# Patient Record
Sex: Female | Born: 2005 | Race: White | Hispanic: No | Marital: Single | Smoking: Never smoker
Health system: Southern US, Community
[De-identification: ages and names within clinical notes are randomized; demographics above are authoritative.]

## PROBLEM LIST (undated history)

## (undated) DIAGNOSIS — K5900 Constipation, unspecified: Secondary | ICD-10-CM

## (undated) DIAGNOSIS — F419 Anxiety disorder, unspecified: Secondary | ICD-10-CM

## (undated) DIAGNOSIS — F32A Depression, unspecified: Secondary | ICD-10-CM

## (undated) DIAGNOSIS — N390 Urinary tract infection, site not specified: Secondary | ICD-10-CM

## (undated) DIAGNOSIS — E669 Obesity, unspecified: Secondary | ICD-10-CM

## (undated) HISTORY — DX: Constipation, unspecified: K59.00

## (undated) HISTORY — DX: Obesity, unspecified: E66.9

## (undated) HISTORY — PX: TYMPANOSTOMY TUBE PLACEMENT: SHX32

## (undated) HISTORY — DX: Depression, unspecified: F32.A

---
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
## 2024-01-06 ENCOUNTER — Encounter (INDEPENDENT_AMBULATORY_CARE_PROVIDER_SITE_OTHER): Payer: Self-pay | Admitting: Nurse Practitioner

## 2024-01-06 ENCOUNTER — Ambulatory Visit (INDEPENDENT_AMBULATORY_CARE_PROVIDER_SITE_OTHER): Payer: Self-pay | Admitting: Nurse Practitioner

## 2024-01-06 ENCOUNTER — Ambulatory Visit: Admitting: Gastroenterology

## 2024-01-06 VITALS — BP 108/75 | HR 82 | Temp 97.8°F | Ht 65.0 in | Wt 259.0 lb

## 2024-01-06 DIAGNOSIS — F322 Major depressive disorder, single episode, severe without psychotic features: Secondary | ICD-10-CM

## 2024-01-06 DIAGNOSIS — E559 Vitamin D deficiency, unspecified: Secondary | ICD-10-CM | POA: Diagnosis not present

## 2024-01-06 DIAGNOSIS — E66813 Obesity, class 3: Secondary | ICD-10-CM

## 2024-01-06 DIAGNOSIS — E88819 Insulin resistance, unspecified: Secondary | ICD-10-CM | POA: Diagnosis not present

## 2024-01-06 DIAGNOSIS — Z6841 Body Mass Index (BMI) 40.0 and over, adult: Secondary | ICD-10-CM

## 2024-01-06 MED ORDER — METFORMIN HCL ER 500 MG PO TB24: 500.0000 mg | ORAL_TABLET | Freq: Every day | ORAL | 0 refills | Status: DC

## 2024-01-06 MED ORDER — VITAMIN D (ERGOCALCIFEROL) 1.25 MG (50000 UNIT) PO CAPS: 50000.0000 [IU] | ORAL_CAPSULE | ORAL | 1 refills | Status: AC

## 2024-01-06 NOTE — Progress Notes (Signed)
 Office: [phone]  /  Fax: [phone]  WEIGHT SUMMARY AND BIOMETRICS  Weight Lost Since Last Visit: 0  Weight Gained Since Last Visit: 1 lb   Vitals Temp: 97.8 F (36.6 C) BP: 108/75 Pulse Rate: 82 SpO2: 98 %   Anthropometric Measurements Height: 5' 5 (1.651 m) Weight: 259 lb (117.5 kg) BMI (Calculated): 43.1 Weight at Last Visit: 258 lb Weight Lost Since Last Visit: 0 Weight Gained Since Last Visit: 1 lb Starting Weight: 258 lb Total Weight Loss (lbs): 0 lb (0 kg) Peak Weight: 296 lb   Body Composition  Body Fat %: 50.2 % Fat Mass (lbs): 130.2 lbs Muscle Mass (lbs): 1226 lbs Total Body Water (lbs): 93.8 lbs Visceral Fat Rating : 12   Other Clinical Data Fasting: No Labs: No Today's Visit #: 2 Starting Date: 12/23/23    Total Weight Loss: 0 Percent of body weight lost: 0  Bio Impedence: Down 0.2 pound of muscle and up 0.8 pound of adipose  HPI  Chief Complaint: OBESITY  Arloa is here to discuss her progress with her obesity treatment plan. She is on the the Category 2 Plan and states she is following her eating plan approximately 50 % of the time. She states she is not currently exercising   Interval History:  Since last office visit she has been doing less sugar coffee drinks. She is drinking lots of water. She has good control during the day but after dinner around 6-7 she is getting cravings for sweets and salty/crunchy. 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 She has a history of depression but mood is currently well controlled without medication.   24 hour recall- looked up at the nutrition information with patient and totaled 1500 calories and this was a day she did not snack Breakfast: grilled chicken McMuffin biscuitville Lunch: Salad and spaghetti(olive Garden), pumpkin spice latte with skim milk Dinner: Steak Chipotle bowl , sweet tea Snacks : none yesterday   PHYSICAL EXAM:  Blood pressure  108/75, pulse 82, temperature 97.8 F (36.6 C), height 5' 5 (1.651 m), weight 259 lb (117.5 kg), SpO2 98%. Body mass index is 43.1 kg/m.  General: She is overweight, cooperative, alert, well developed, and in no acute distress. PSYCH: Has normal mood, affect and thought process.   Extremities: No edema.  Neurologic: No gross sensory or motor deficits. No tremors or fasciculations noted.    DIAGNOSTIC DATA REVIEWED: ALL LAB ARE REVIEWED IN DETAIL WITH PATIENT  Last metabolic panel Lab Results  Component Value Date   GLUCOSE 79 12/23/2023   NA 139 12/23/2023   K 4.6 12/23/2023   CL 102 12/23/2023   CO2 24 12/23/2023   BUN 10 12/23/2023   CREATININE 0.60 12/23/2023   EGFR 133 12/23/2023   CALCIUM 9.2 12/23/2023   PROT 7.4 12/14/2023   ALBUMIN 3.8 12/14/2023   BILITOT 0.8 12/14/2023   ALKPHOS 40 12/14/2023   AST 17 12/14/2023   ALT 17 12/14/2023   ANIONGAP 9 12/14/2023   Normal electrolytes, kidney and liver functions  MOST RECENT A1C is 4.9 07/10/23 A1c is normal   TSH 07/10/23 was 1.695 Free T4 0.9  Both are normal  Lab Results  Component Value Date   INSULIN  13.0 12/23/2023  HOMA-IR score 2.53- insulin  resistance    CBC    Component Value Date/Time   WBC 10.8 (H) 12/14/2023 2058   RBC 4.74 12/14/2023 2058   HGB 13.8 12/14/2023 2058  HCT 40.8 12/14/2023 2058   PLT 270 12/14/2023 2058   MCV 86.1 12/14/2023 2058   MCH 29.1 12/14/2023 2058   MCHC 33.8 12/14/2023 2058   RDW 12.7 12/14/2023 2058  CBC is normal  Lipid Panel  Most recent lipid panel 07/10/23 Total Cholesterol 151 Triglycerides- 72 HDL 51 LDL 84 Non-HDL 100 Lipid panel looks good    Nutritional Lab Results  Component Value Date   VD25OH 25.7 (L) 12/23/2023  Vit D is low, Vit D supplementation is required   ASSESSMENT AND PLAN  TREATMENT PLAN FOR OBESITY:  Recommended Dietary Goals  Gigi is currently in the action stage of change. As such, her goal is to continue weight  management plan. She has agreed to the Category 2 Plan.  Behavioral Intervention  We discussed the following Behavioral Modification Strategies today: better snacking choices, continue to work on maintaining a reduced calorie state, getting the recommended amount of protein, incorporating whole foods, making healthy choices, staying well hydrated and practicing mindfulness when eating., and increase protein intake, fibrous foods (25 grams per day for women, 30 grams for men) and water to improve satiety and decrease hunger signals. .  Additional resources provided today: NA  Recommended Physical Activity Goals  Sissi has been advised to work up to 150 minutes of moderate intensity aerobic activity a week and strengthening exercises 2-3 times per week for cardiovascular health, weight loss maintenance and preservation of muscle mass.   She has agreed to Think about enjoyable ways to increase daily physical activity and overcoming barriers to exercise, Increase physical activity in their day and reduce sedentary time (increase NEAT)., and Start aerobic activity with a goal of 150 minutes a week at moderate intensity.    Pharmacotherapy We discussed various medication options to help Delissa with her weight loss efforts and we both agreed to start Metfromin XR 500 mg 1 tab PO every day with largest meal for insulin  resistance and off label for weight loss. Will also start ergocalciferol  50000 units once a week. .  ASSOCIATED CONDITIONS ADDRESSED TODAY  Action/Plan  MDD (major depressive disorder), single episode, severe , no psychosis (HCC) Currently well controlled without medication  Insulin  resistance Continue category 2 meal plan and focus on limiting simple carbohydrate, decrease sweet coffee drinks with ultimate goal of stopping altogether Start Metformin  ER 500 mg 1 tab PO every day with largest meal -     metFORMIN  HCl ER; Take 1 tablet (500 mg total) by mouth daily with breakfast.   Dispense: 30 tablet; Refill: 0  Vitamin D  deficiency Initiate ergocalciferol  50000 units once a week.  Will plan for 12 weeks and then recheck value -     Vitamin D  (Ergocalciferol ); Take 1 capsule (50,000 Units total) by mouth every 7 (seven) days.  Dispense: 5 capsule; Refill: 1  Class 3 severe obesity with body mass index (BMI) of 40.0 to 44.9 in adult  Counseled on adherence to category 2 meal plan  Reviewed Fast food healthier options and gave patient handout  Continue to decrease sweet coffee drinks, work on healthier snack options  Start Metformin  500 mg every day off label for weight loss  Encouraged to start cardio with an initial goal of 150 minutes/week        Return in about 2 weeks (around 01/20/2024).SABRA She was informed of the importance of frequent follow up visits to maximize her success with intensive lifestyle modifications for her multiple health conditions.   ATTESTASTION STATEMENTS:  Reviewed by clinician  on day of visit: allergies, medications, problem list, medical history, surgical history, family history, social history, and previous encounter notes.   I personally spent a total of 41 minutes in the care of the patient today including preparing to see the patient, getting/reviewing separately obtained history, performing a medically appropriate exam/evaluation, counseling and educating, placing orders, documenting clinical information in the EHR, independently interpreting results, communicating results, and coordinating care.   Jaynell Castagnola ANP-C

## 2024-01-06 NOTE — Progress Notes (Deleted)
 Paula Allen [id number] October 21, 2005   Chief Complaint: Abdominal pain  Referring Provider: Steva Meeter [facility]* Primary GI MD: Sampson  HPI: Paula Allen is a 18 y.o. female with past medical history of anxiety/depression, constipation, obesity who presents today for a complaint of *** .    Patient seen in the ED 12/14/2023 for complaint of abdominal pain described as a dull ache in her upper abdomen.  Initially felt constipated and was passing only small amount of thin stool, but then again of diarrhea later in the week.  Had some mild epigastric tenderness on exam.  Labs without significant anemia, leukocytosis, electrolyte abnormality, or AKI.  LFTs and lipase unremarkable, pregnancy test negative, no sign of infection on urinalysis.  Gastritis suspected and patient started on PPI.  Ultrasound was ordered by patient's pediatrician but has not been completed.  Follows with healthy weight and wellness.  Recently found to have low vitamin D  25.7    Previous GI Procedures/Imaging      Past Medical History:  Diagnosis Date   Anxiety    Constipation    Depression    Obesity    Urinary tract infection     Past Surgical History:  Procedure Laterality Date   TYMPANOSTOMY TUBE PLACEMENT      Current Outpatient Medications  Medication Sig Dispense Refill   busPIRone  (BUSPAR ) 10 MG tablet Take 1 tablet (10 mg total) by mouth 2 (two) times daily. (Patient not taking: Reported on 01/06/2024) 60 tablet 0   hydrOXYzine  (ATARAX /VISTARIL ) 25 MG tablet Take 1 tablet (25 mg total) by mouth 3 (three) times daily as needed for anxiety. 30 tablet 0   ibuprofen  (ADVIL ) 400 MG tablet Take 400 mg by mouth every 6 (six) hours as needed for headache or mild pain.     metFORMIN  (GLUCOPHAGE -XR) 500 MG 24 hr tablet Take 1 tablet (500 mg total) by mouth daily with breakfast. 30 tablet 0   Vitamin D , Ergocalciferol , (DRISDOL ) 1.25 MG (50000 UNIT) CAPS capsule Take 1 capsule (50,000 Units  total) by mouth every 7 (seven) days. 5 capsule 1   No current facility-administered medications for this visit.    Allergies as of 01/06/2024   (No Known Allergies)    Family History  Problem Relation Age of Onset   Obesity Mother    Hypertension Mother    Thyroid disease Mother    Cancer Father    Hyperlipidemia Father    Hypertension Father    Diabetes Father    Asthma Other    Cancer Other    COPD Other    Hyperlipidemia Other    Hypertension Other    Obesity Other     Social History   Tobacco Use   Smoking status: Never   Smokeless tobacco: Never  Substance Use Topics   Alcohol use: Never   Drug use: Never     Review of Systems:    Constitutional: No weight loss, fever, chills, weakness or fatigue Eyes: No change in vision Ears, Nose, Throat:  No change in hearing or congestion Skin: No rash or itching Cardiovascular: No chest pain, chest pressure or palpitations   Respiratory: No SOB or cough Gastrointestinal: See HPI and otherwise negative Genitourinary: No dysuria or change in urinary frequency Neurological: No headache, dizziness or syncope Musculoskeletal: No new muscle or joint pain Hematologic: No bleeding or bruising    Physical Exam:  Vital signs: There were no vitals taken for this visit.  Constitutional: NAD, Well developed, Well nourished, alert and  cooperative Head:  Normocephalic and atraumatic.  Eyes: No scleral icterus. Conjunctiva pink. Mouth: No oral lesions. Respiratory: Respirations even and unlabored. Lungs clear to auscultation bilaterally.  No wheezes, crackles, or rhonchi.  Cardiovascular:  Regular rate and rhythm. No murmurs. No peripheral edema. Gastrointestinal:  Soft, nondistended, nontender. No rebound or guarding. Normal bowel sounds. No appreciable masses or hepatomegaly. Rectal:  Not performed.  Neurologic:  Alert and oriented x4;  grossly normal neurologically.  Skin:   Dry and intact without significant lesions or  rashes. Psychiatric: Oriented to person, place and time. Demonstrates good judgement and reason without abnormal affect or behaviors.   RELEVANT LABS AND IMAGING: CBC    Component Value Date/Time   WBC 10.8 (H) 12/14/2023 2058   RBC 4.74 12/14/2023 2058   HGB 13.8 12/14/2023 2058   HCT 40.8 12/14/2023 2058   PLT 270 12/14/2023 2058   MCV 86.1 12/14/2023 2058   MCH 29.1 12/14/2023 2058   MCHC 33.8 12/14/2023 2058   RDW 12.7 12/14/2023 2058   LYMPHSABS 3.5 09/21/2022 0521   MONOABS 0.5 09/21/2022 0521   EOSABS 0.1 09/21/2022 0521   BASOSABS 0.1 09/21/2022 0521    CMP     Component Value Date/Time   NA 139 12/23/2023 0907   K 4.6 12/23/2023 0907   CL 102 12/23/2023 0907   CO2 24 12/23/2023 0907   GLUCOSE 79 12/23/2023 0907   GLUCOSE 82 12/14/2023 2058   BUN 10 12/23/2023 0907   CREATININE 0.60 12/23/2023 0907   CALCIUM 9.2 12/23/2023 0907   PROT 7.4 12/14/2023 2058   ALBUMIN 3.8 12/14/2023 2058   AST 17 12/14/2023 2058   ALT 17 12/14/2023 2058   ALKPHOS 40 12/14/2023 2058   BILITOT 0.8 12/14/2023 2058   GFRNONAA >60 12/14/2023 2058   GFRAA NOT CALCULATED 04/09/2019 0708     Assessment/Plan:       Camie Furbish, PA-C [facility] 01/06/2024, 12:22 PM  Patient Care Team: [place], [facility] as PCP - General

## 2024-01-20 ENCOUNTER — Encounter (INDEPENDENT_AMBULATORY_CARE_PROVIDER_SITE_OTHER): Payer: Self-pay | Admitting: Adult Health

## 2024-01-20 ENCOUNTER — Ambulatory Visit (INDEPENDENT_AMBULATORY_CARE_PROVIDER_SITE_OTHER): Admitting: Nurse Practitioner

## 2024-01-20 ENCOUNTER — Ambulatory Visit (INDEPENDENT_AMBULATORY_CARE_PROVIDER_SITE_OTHER): Admitting: Adult Health

## 2024-01-20 VITALS — BP 93/65 | HR 99 | Temp 97.8°F | Ht 65.0 in | Wt 250.0 lb

## 2024-01-20 DIAGNOSIS — R5383 Other fatigue: Secondary | ICD-10-CM | POA: Diagnosis not present

## 2024-01-20 DIAGNOSIS — F322 Major depressive disorder, single episode, severe without psychotic features: Secondary | ICD-10-CM | POA: Diagnosis not present

## 2024-01-20 DIAGNOSIS — E559 Vitamin D deficiency, unspecified: Secondary | ICD-10-CM

## 2024-01-20 DIAGNOSIS — E88819 Insulin resistance, unspecified: Secondary | ICD-10-CM

## 2024-01-20 DIAGNOSIS — E66813 Obesity, class 3: Secondary | ICD-10-CM

## 2024-01-20 DIAGNOSIS — Z6841 Body Mass Index (BMI) 40.0 and over, adult: Secondary | ICD-10-CM

## 2024-01-20 MED ORDER — METFORMIN HCL ER 500 MG PO TB24: 500.0000 mg | ORAL_TABLET | Freq: Every day | ORAL | 0 refills | Status: AC

## 2024-01-20 NOTE — Progress Notes (Signed)
 WEIGHT SUMMARY AND BIOMETRICS  Vitals Temp: 97.8 F (36.6 C) BP: 93/65 Pulse Rate: 99 SpO2: 99 %   Anthropometric Measurements Height: 5' 5 (1.651 m) Weight: 250 lb (113.4 kg) BMI (Calculated): 41.6 Weight at Last Visit: 259 lb Weight Lost Since Last Visit: 9 Weight Gained Since Last Visit: 0 Starting Weight: 258 lb Total Weight Loss (lbs): 8 lb (3.629 kg) Peak Weight: 296 lb   Body Composition  Body Fat %: 48.1 % Fat Mass (lbs): 120.6 lbs Muscle Mass (lbs): 123.6 lbs Total Body Water (lbs): 90.2 lbs Visceral Fat Rating : 11   Other Clinical Data Fasting: no Labs: no Today's Visit #: 3 Starting Date: 12/23/23    Chief Complaint:   OBESITY Paula Allen is here to discuss her progress with her obesity treatment plan.  She is on the the Category 2 Plan and states she is following her eating plan approximately 90 % of the time.  She states she is exercising Walking 30-45 minutes 3 times per week.  Interim History:  Paula Allen provided the following food recall that is typical of a day: Breakfast: Eggs with cottage cheese and vegetables Lunch: Salad with meat protein Dinner: meat protein with 2 cups vegetables  She lives with her mother and father. She is completing her GED remotely and hopes to take the final examination soon. 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  Reviewed Bioimpedance results with pt: Muscle Mass: + 1 lb Adipose Mass: - 9.6 lbs  Subjective:   1. Insulin  resistance  Latest Reference Range & Units 12/23/23 09:07  INSULIN  2.6 - 24.9 uIU/mL 13.0   Lab Results  Component Value Date   HGBA1C 5.0 04/09/2019    She was started on daily Metformin  XR 500mg , she has been consistently taking with dinner meal She denies GI upset  2. Vitamin D  deficiency  Latest Reference Range & Units 12/23/23 09:07  Vitamin D , 25-Hydroxy 30.0 - 100.0 ng/mL 25.7 (L)  (L): Data is abnormally low  3. Other fatigue  Latest Reference Range &  Units 12/23/23 09:07  Vitamin D , 25-Hydroxy 30.0 - 100.0 ng/mL 25.7 (L)  Vitamin B12 232 - 1,245 pg/mL 572  (L): Data is abnormally low  She was recently started on weekly Ergocalciferol   4. MDD (major depressive disorder), single episode, severe , no psychosis (HCC) PDMP reviewed- nothing listed in last 2 years She has not used Buspar  10mg  BID for months She estimates to use PRN Hydroxxyzine 25mg , 1 or 2 doses monthly She endorses stable mood, denies SI/HI  Assessment/Plan:   1. Insulin  resistance (Primary) Refill  - metFORMIN  (GLUCOPHAGE -XR) 500 MG 24 hr tablet; Take 1 tablet (500 mg total) by mouth daily with breakfast.  Dispense: 30 tablet; Refill: 0  2. Vitamin D  deficiency Continue weekly Ergocalciferol - denies need for refill today  3. Other fatigue Continue healthy eating, regular walking, and weekly Ergocalciferol   4. MDD (major depressive disorder), single episode, severe , no psychosis (HCC) Continue healthy eating, regular walking, and PRN Hydroxxyzine 25mg   5. Class 3 severe obesity with body mass index (BMI) of 40.0 to 44.9 in adult, CURRENT BMI 41.7  Paula Allen is currently in the action stage of change. As such, her goal is to continue with weight loss efforts. She has agreed to the Category 2 Plan.   Exercise goals: All adults should avoid inactivity. Some physical activity is better than none, and adults who participate in any amount of physical activity gain some health benefits.  Adults should also include muscle-strengthening activities that involve all major muscle groups on 2 or more days a week.  Behavioral modification strategies: increasing lean protein intake, decreasing simple carbohydrates, increasing vegetables, no skipping meals, meal planning and cooking strategies, keeping healthy foods in the home, ways to avoid boredom eating, and planning for success.  Paula Allen has agreed to follow-up with our clinic in 4 weeks. She was informed of the importance of  frequent follow-up visits to maximize her success with intensive lifestyle modifications for her multiple health conditions.   Objective:   Blood pressure 93/65, pulse 99, temperature 97.8 F (36.6 C), height 5' 5 (1.651 m), weight 250 lb (113.4 kg), SpO2 99%. Body mass index is 41.6 kg/m.  General: Cooperative, alert, well developed, in no acute distress. HEENT: Conjunctivae and lids unremarkable. Cardiovascular: Regular rhythm.  Lungs: Normal work of breathing. Neurologic: No focal deficits.   Lab Results  Component Value Date   CREATININE 0.60 12/23/2023   BUN 10 12/23/2023   NA 139 12/23/2023   K 4.6 12/23/2023   CL 102 12/23/2023   CO2 24 12/23/2023   Lab Results  Component Value Date   ALT 17 12/14/2023   AST 17 12/14/2023   ALKPHOS 40 12/14/2023   BILITOT 0.8 12/14/2023   Lab Results  Component Value Date   HGBA1C 5.0 04/09/2019   Lab Results  Component Value Date   INSULIN  13.0 12/23/2023   Lab Results  Component Value Date   TSH 1.693 04/09/2019   Lab Results  Component Value Date   CHOL 141 04/09/2019   HDL 36 (L) 04/09/2019   LDLCALC 86 04/09/2019   TRIG 94 04/09/2019   CHOLHDL 3.9 04/09/2019   Lab Results  Component Value Date   VD25OH 25.7 (L) 12/23/2023   Lab Results  Component Value Date   WBC 10.8 (H) 12/14/2023   HGB 13.8 12/14/2023   HCT 40.8 12/14/2023   MCV 86.1 12/14/2023   PLT 270 12/14/2023   No results found for: IRON, TIBC, FERRITIN  Attestation Statements:   Reviewed by clinician on day of visit: allergies, medications, problem list, medical history, surgical history, family history, social history, and previous encounter notes.  I have reviewed the above documentation for accuracy and completeness, and I agree with the above. -  Miosha Behe d. Sultana Tierney, NP-C

## 2024-02-01 ENCOUNTER — Ambulatory Visit (INDEPENDENT_AMBULATORY_CARE_PROVIDER_SITE_OTHER): Admitting: Nurse Practitioner

## 2024-03-01 ENCOUNTER — Ambulatory Visit: Admitting: Gastroenterology

## 2024-03-01 NOTE — Progress Notes (Deleted)
 Paula Allen [id number] 26-Mar-2006   Chief Complaint: Abdominal pain  Referring Provider: Steva Meeter [facility] Primary GI MD: Sampson  HPI: Paula Allen is a 18 y.o. female with past medical history of anxiety/depression, constipation, obesity who presents today for a complaint of *** .    Patient seen in the ED 12/14/2023 for complaint of abdominal pain described as a dull ache in her upper abdomen.  Initially felt constipated and was passing only small amount of thin stool, but then again of diarrhea later in the week.  Had some mild epigastric tenderness on exam.  Labs without significant anemia, leukocytosis, electrolyte abnormality, or AKI.  LFTs and lipase unremarkable, pregnancy test negative, no sign of infection on urinalysis.  Gastritis suspected and patient started on PPI.   Ultrasound was ordered by patient's pediatrician but has not been completed.   Follows with healthy weight and wellness.   Recently found to have low vitamin D  25.7  Follows with healthy weight and wellness   Discussed the use of AI scribe software for clinical note transcription with the patient, who gave verbal consent to proceed.  History of Present Illness       Previous GI Procedures/Imaging      Past Medical History:  Diagnosis Date   Anxiety    Constipation    Depression    Obesity    Urinary tract infection     Past Surgical History:  Procedure Laterality Date   TYMPANOSTOMY TUBE PLACEMENT      Current Outpatient Medications  Medication Sig Dispense Refill   hydrOXYzine  (ATARAX /VISTARIL ) 25 MG tablet Take 1 tablet (25 mg total) by mouth 3 (three) times daily as needed for anxiety. 30 tablet 0   ibuprofen  (ADVIL ) 400 MG tablet Take 400 mg by mouth every 6 (six) hours as needed for headache or mild pain. (Patient not taking: Reported on 01/20/2024)     metFORMIN  (GLUCOPHAGE -XR) 500 MG 24 hr tablet Take 1 tablet (500 mg total) by mouth daily with breakfast. 30 tablet  0   Vitamin D , Ergocalciferol , (DRISDOL ) 1.25 MG (50000 UNIT) CAPS capsule Take 1 capsule (50,000 Units total) by mouth every 7 (seven) days. 5 capsule 1   No current facility-administered medications for this visit.    Allergies as of 03/01/2024   (No Known Allergies)    Family History  Problem Relation Age of Onset   Obesity Mother    Hypertension Mother    Thyroid disease Mother    Cancer Father    Hyperlipidemia Father    Hypertension Father    Diabetes Father    Asthma Other    Cancer Other    COPD Other    Hyperlipidemia Other    Hypertension Other    Obesity Other     Social History   Tobacco Use   Smoking status: Never   Smokeless tobacco: Never  Substance Use Topics   Alcohol use: Never   Drug use: Never     Review of Systems:    Constitutional: No weight loss, fever, chills, weakness or fatigue Eyes: No change in vision Ears, Nose, Throat:  No change in hearing or congestion Skin: No rash or itching Cardiovascular: No chest pain, chest pressure or palpitations   Respiratory: No SOB or cough Gastrointestinal: See HPI and otherwise negative Genitourinary: No dysuria or change in urinary frequency Neurological: No headache, dizziness or syncope Musculoskeletal: No new muscle or joint pain Hematologic: No bleeding or bruising    Physical Exam:  Vital  signs: There were no vitals taken for this visit.  Constitutional: NAD, Well developed, Well nourished, alert and cooperative Head:  Normocephalic and atraumatic.  Eyes: No scleral icterus. Conjunctiva pink. Mouth: No oral lesions. Respiratory: Respirations even and unlabored. Lungs clear to auscultation bilaterally.  No wheezes, crackles, or rhonchi.  Cardiovascular:  Regular rate and rhythm. No murmurs. No peripheral edema. Gastrointestinal:  Soft, nondistended, nontender. No rebound or guarding. Normal bowel sounds. No appreciable masses or hepatomegaly. Rectal:  Not performed.  Neurologic:  Alert  and oriented x4;  grossly normal neurologically.  Skin:   Dry and intact without significant lesions or rashes. Psychiatric: Oriented to person, place and time. Demonstrates good judgement and reason without abnormal affect or behaviors.   RELEVANT LABS AND IMAGING: CBC    Component Value Date/Time   WBC 10.8 (H) 12/14/2023 2058   RBC 4.74 12/14/2023 2058   HGB 13.8 12/14/2023 2058   HCT 40.8 12/14/2023 2058   PLT 270 12/14/2023 2058   MCV 86.1 12/14/2023 2058   MCH 29.1 12/14/2023 2058   MCHC 33.8 12/14/2023 2058   RDW 12.7 12/14/2023 2058   LYMPHSABS 3.5 09/21/2022 0521   MONOABS 0.5 09/21/2022 0521   EOSABS 0.1 09/21/2022 0521   BASOSABS 0.1 09/21/2022 0521    CMP     Component Value Date/Time   NA 139 12/23/2023 0907   K 4.6 12/23/2023 0907   CL 102 12/23/2023 0907   CO2 24 12/23/2023 0907   GLUCOSE 79 12/23/2023 0907   GLUCOSE 82 12/14/2023 2058   BUN 10 12/23/2023 0907   CREATININE 0.60 12/23/2023 0907   CALCIUM 9.2 12/23/2023 0907   PROT 7.4 12/14/2023 2058   ALBUMIN 3.8 12/14/2023 2058   AST 17 12/14/2023 2058   ALT 17 12/14/2023 2058   ALKPHOS 40 12/14/2023 2058   BILITOT 0.8 12/14/2023 2058   GFRNONAA >60 12/14/2023 2058   GFRAA NOT CALCULATED 04/09/2019 0708     Assessment/Plan:    Assessment and Plan Assessment & Plan        Camie Furbish, PA-C [facility] 03/01/2024, 9:01 AM  Patient Care Team: [facility] as PCP - General

## 2024-05-30 ENCOUNTER — Other Ambulatory Visit (INDEPENDENT_AMBULATORY_CARE_PROVIDER_SITE_OTHER): Payer: Self-pay | Admitting: Nurse Practitioner

## 2024-05-30 DIAGNOSIS — E559 Vitamin D deficiency, unspecified: Secondary | ICD-10-CM
